# Patient Record
Sex: Male | Born: 1990 | Race: White | Hispanic: No | Marital: Single | Smoking: Never smoker
Health system: Southern US, Community
[De-identification: ages and names within clinical notes are randomized; demographics above are authoritative.]

---
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
## 2009-03-16 IMAGING — RF DG FLUORO GUIDE NDL PLC/BX
2 series · 2 of 2 positions shown · IV contrast (omniscan)
Comparison: none

CLINICAL DATA: Pre MRI.  
 RIGHT SHOULDER INJECTION FOR MR:
 The skin anterior to the right shoulder was cleansed and anesthetized.  A 22 gauge spinal needle was placed on one pass into the shoulder joint.  Hypaque mixed with dilute Omniscan was injected into the shoulder joint.  Limited filming shows no undersurface cuff abnormality.  The procedure was well-tolerated.

[Series 1: (hospital) · 1 of 1 slices shown (1 of 2)]
[im 1/1]
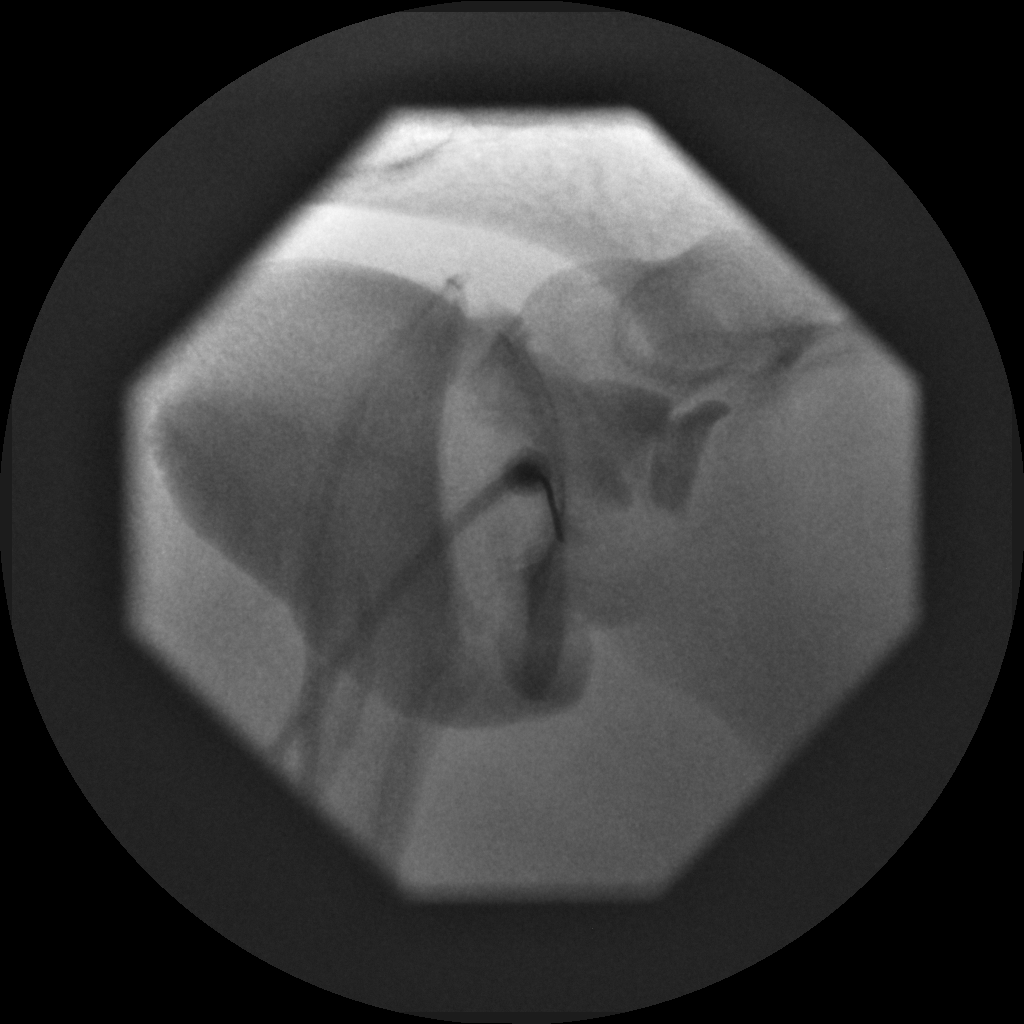

[Series 2: (hospital) · 1 of 1 slices shown (2 of 2)]
[im 1/1]
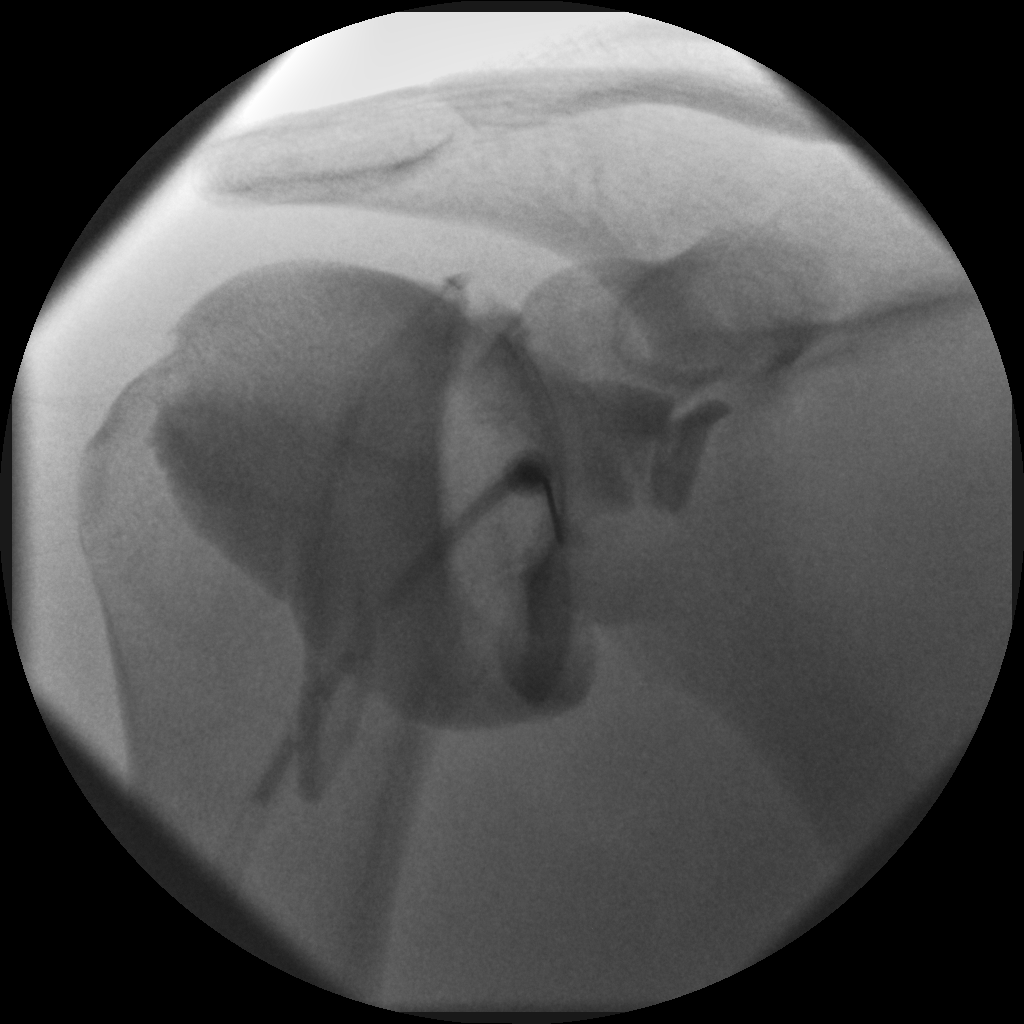

[2 of 2 positions shown; findings below may reference images not displayed]

IMPRESSION: Shoulder injection for MRI as described.

## 2012-10-06 DIAGNOSIS — B356 Tinea cruris: Secondary | ICD-10-CM | POA: Insufficient documentation

## 2015-01-17 ENCOUNTER — Other Ambulatory Visit: Payer: Self-pay | Admitting: Occupational Medicine

## 2015-01-17 ENCOUNTER — Ambulatory Visit
Admission: RE | Admit: 2015-01-17 | Discharge: 2015-01-17 | Disposition: A | Discharge status: Home / Self Care | Payer: No Typology Code available for payment source | Source: Ambulatory Visit | Attending: Occupational Medicine | Admitting: Occupational Medicine

## 2015-01-17 DIAGNOSIS — Z021 Encounter for pre-employment examination: Secondary | ICD-10-CM

## 2015-02-22 ENCOUNTER — Encounter (HOSPITAL_COMMUNITY): Payer: Self-pay | Admitting: Medical

## 2015-02-22 ENCOUNTER — Ambulatory Visit (HOSPITAL_COMMUNITY): Payer: BLUE CROSS/BLUE SHIELD | Admitting: Medical

## 2015-02-22 VITALS — BP 128/70 | HR 73 | Ht 72.0 in | Wt 215.0 lb

## 2015-02-22 DIAGNOSIS — F32A Depression, unspecified: Secondary | ICD-10-CM | POA: Insufficient documentation

## 2015-02-22 DIAGNOSIS — F411 Generalized anxiety disorder: Secondary | ICD-10-CM | POA: Insufficient documentation

## 2015-02-22 DIAGNOSIS — Z8659 Personal history of other mental and behavioral disorders: Secondary | ICD-10-CM | POA: Insufficient documentation

## 2015-02-22 DIAGNOSIS — F988 Other specified behavioral and emotional disorders with onset usually occurring in childhood and adolescence: Secondary | ICD-10-CM | POA: Insufficient documentation

## 2015-02-22 DIAGNOSIS — F329 Major depressive disorder, single episode, unspecified: Secondary | ICD-10-CM | POA: Insufficient documentation

## 2015-02-22 NOTE — Progress Notes (Signed)
Psychiatric Initial Adult Assessment   Patient Identification: Micheal Price MRN:  [id number] Date of Evaluation:  02/22/2015 Referral Source:Helene Kelp MD [facility] Subjective:  Patient ID: Micheal Price is a 25 y.o. (DOB 01-Jul-1990) male. HPI Comments: Winn presents today for a medication refill - he is not currently on any medication; he presents today requesting that he be restarted on ADHD medication; he had taken the medication earlier in his life but has not taken any ADHD medication in several years The patient's allergies, current medications, past family history, past medical history, past social history, past surgical history and problem list were reviewed and updated as appropriate.  Review of Systems  Psychiatric/Behavioral: Positive for decreased concentration.  All other systems reviewed and are negative. Objective:  BP 130/82 (BP Location: Right arm, Patient Position: Sitting) Pulse 76 Temp 98.4 F (36.9 C) (Oral) Ht 6' (1.829 m) Wt 221 lb (100.2 kg) BMI 29.97 kg/m2 Physical Exam  Constitutional: He appears well-developed and well-nourished. No distress.  Body mass index is 29.97 kg/(m^2). afebrile  HENT:  Head: Normocephalic and atraumatic.  Cardiovascular: Normal rate and regular rhythm.  Pulmonary/Chest: Effort normal and breath sounds normal.  Skin: He is not diaphoretic.  Psychiatric: He has a normal mood and affect. Judgment and thought content normal.  Nursing note and vitals reviewed. Assessment / Plan:  1. Poor concentration Ambulatory referral to Psychiatry  2. Attention deficit hyperactivity disorder (ADHD), predominantly inattentive type  Plan Take medications as directed Keep appointments with specialists as directed Good sleep hygiene Exercise 30-45 minutes 4-5 days a week Limit fast food to once a week or less Ideally BMI should be 25 or less Participate in regular social activities Return to clinic to refill ADHD medication if  your are diagnosed and treatment is started Patient's Medications  No medications on file  Orders Placed This Encounter  Procedures  . Ambulatory referral to Psychiatry   Chief Complaint:  Poor concentration Visit Diagnosis: No diagnosis found. Diagnosis:  There are no active problems to display for this patient.  History of Present Illness:  24 Y/O wm SEEKING ADDERALL BASED ON COLLEGE EXPERIENCE PENDING STARTING [employer] Feb 1 ,2017.  Anticipates fatigue and difficulty reading (focus-15 minute attention span) he experienced in [facility] 2012 while on [address] scholarship.He saw new provider at [facility] who referred him..Reports he had/has no difficulty with things he is interested in. Wants RX for Feb 1 - end of July -cant come for appts while in school. Relates he was tested in 3rd grade but did not require medication due to grades C and above.Failed to circle depression score on intake-PHQ ( score was 15 consistent with MDD He denies being/feeling depressed. He is not interested in alternatives to Adderall.Doesnt understand why he needs FU as he didn't have a lot of questions before when he got Adderall and got medicine without .  Associated Signs/Symptoms: Racing thoughts;Loss of interest;Excesssive worrying (about passing firefighter school);Poor concentration ASRS-v1.1 Score 36 Vague report of treatment for mental health problem when younger "Not sure" what; where Depression Symptoms:  PHQ 9 Score 15-pt unaware (Hypo) Manic Symptoms:  Distractibility, Labiality of Mood, Anxiety Symptoms:  Excessive Worry, Psychotic Symptoms:  None PTSD Symptoms: Negative  Past Medical History: No past medical history on file. No past surgical history on file. Family History: No family history on file. Social History:   Social History   Social History  . Marital Status: Single    Spouse Name: N/A  . Number of  Children: N/A  . Years of Education: N/A   Social History Main Topics  .  Smoking status: Never Smoker   . Smokeless tobacco: Current User    Types: Snuff  . Alcohol Use: 0.6 oz/week    1 Cans of beer per week  . Drug Use: No  . Sexual Activity: Not Asked   Other Topics Concern  . None   Social History Narrative  . 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 Patient reports that he transferred from Dr. Sonny Dandy office. He had trouble focusing in school. He is 19. He was diagnosed with ADHD as a child. Stopped medications in 2011. He said that the Ritalin, he did not like it. He did not like the way it made him feel. He denies addiction problems. No depression, suicidal or homicidal ideation. Current Medications None. Allergies No known drug allergies. No tobacco or drug abuse. Occasional alcohol. Single male. Hospitalizations None. Surgeries Appendix. Family History Benign. Occupation Consulting civil engineer. Objective GENERAL: Well-nourished white male. VITAL SIGNS: Noted in the chart. HEENT: PERRLA, both direct and consensual; EOMI. NECK: Without JVD, bruits or thyromegaly. HEART: Regular rate and rhythm without extra sounds. LUNGS: CTA. ABDOMEN: Soft without MTO; bowel sounds present. EXTREMITIES: Without clubbing, cyanosis or edema; equal pulses bilaterally. Assessment And Plan History of attention deficit hyperactivity disorder. His mother did refer him. Mother is a patient here, and those will be transferred from [facility]. Will continue to monitor. Will try Adderall XR 30 mg daily, #30 with no refills. Side effects discussed. Will follow up in 1 month, sooner if need Back to top of  Progress Notes [name], NP - 10/21/2010 9:38 AM EDT Formatting of this note may be different from the original. Subjective  Patient ID: Micheal Price is a 25 y.o. (DOB July 19, 1990) male.  Patient presents with  . Medication Refill  . ADD  had all A and B's, doing well in baseball, going [employer]. Denies chest pain, denies history of heart disease.  Past Medical History, Past Surgery History, Allergies, Social History, and Family History were reviewed and updated.  Review of Systems is complete and negative except as noted. Objective  BP 122/68  Pulse 70  Resp 17  Ht 6' (1.829 m)  Wt 207 lb 9.6 oz (94.167 kg)  BMI 28.16 kg/m2 General: Well Developed, Well Nourished, No distress HEENT: Normocephalic, atraumatic, PERRLA, Conjunctiva normal, Bilateral EAC and TM normal, Nares normal, Oropharynx moist and clear Neck: Supple with normal range of motion, No LAD  CV: S1S2, RRR without MGR  Lungs: CTA with normal effort Abdomen: BS active, soft, NTND, No HSM/Masses  Skin: No Focal Rashes  Extremities: No C/C/E. DP/PT 2+ Neuro: No focal deficits, CN II-XII intact  Impression  1. ADD (attention deficit disorder with hyperactivity) amphetamine-dextroamphetamine (ADDERALL) 30 MG tablet, 7 Drug-[facility], Comp. Metabolic Panel (14), POCT CBC W/O DIFF  2. Encounter for long-term (current) use of other medications 7 Drug-Unbund, Comp. Metabolic Panel (14), POCT CBC W/O DIFF Controlled Substance management agreement was explained and signed by patient and provider. Patient understands that breaking this agreement can result in dismissal  from practice or discontinuation of medication being prescribed as stated on pain contract. Agreement is scanned into system and FYI updated.  1. ADD (attention deficit disorder with hyperactivity)  2. Encounter for long-term (current) use of other medications     Musculoskeletal: Strength & Muscle Tone: within normal limits Gait  & Station: normal Patient leans: N/A  Psychiatric Specialty Exam: HPIas above  Review of Systems  Constitutional: Negative for fever, chills, weight loss, malaise/fatigue and diaphoresis.  Neurological: Negative for dizziness, tingling, tremors, sensory change, speech change, focal weakness, seizures, loss of consciousness and weakness.  Psychiatric/Behavioral: Positive for depression (asymptomatic to patient-thinks it is ADD). Negative for suicidal ideas, hallucinations, memory loss and substance abuse. The patient is nervous/anxious and has insomnia.   All other systems reviewed and are negative.   Blood pressure 128/70, pulse 73, height 6' (1.829 m), weight 215 lb (97.523 kg), SpO2 96 %.Body mass index is 29.15 kg/(m^2).  General Appearance: Casual and Well Groomed  Eye Contact:  Good  Speech:  Normal Rate, Pressured when excited  Volume:  Normal  Mood:  Anxious  Affect:  Congruent  Thought Process:  Circumstantial, Coherent and Goal Directed  Orientation:  Full (Time, Place, and Person)  Thought Content:  WDL and Focused on obtaining Adderall to succeed at [employer] school  Suicidal Thoughts:  No  Homicidal Thoughts:  No  Memory:  Negative  Judgement:  Fair  Insight:  Lacking  Psychomotor Activity:  Normal  Concentration:  Intact for visit  Recall:  [employer] of Knowledge:Fair  Language: Fair  Akathisia:  NA  Handed:  Right  AIMS (if indicated):  na  Assets:  Desire for Improvement Financial Resources/Insurance Housing Physical Health Social Support Transportation Vocational/Educational  ADL's:  Intact  Cognition: Impaired,  Mild not in touch with feelings  Sleep:  Minor problem 1/3   Is the patient at risk to self?  No. Has the patient been a risk to self in the past 6 months?  No. Has the patient been a risk to self within the distant past?  No. Is the patient a risk to others?  No. Has the patient been a risk to others in the past 6 months?  No. Has the  patient been a risk to others within the distant past?  No.  Allergies:  No Known Allergies Current Medications: No current outpatient prescriptions on file.   No current facility-administered medications for this visit.    Previous Psychotropic Medications: Adderall  Substance Abuse History in the last 12 months:  No.  Consequences of Substance Abuse: NA  Medical Decision Making:  Review and summation of old records (2), Review of Medication Regimen & Side Effects (2) and Review of New Medication or Change in Dosage (2)  Treatment Plan Summary: Pt informed we cannot meet his perceived needs and he should reconnect with his old provider who may have weekend FU if needed. .Advised we understand he does not feel his depression at a conscious level but that it IS there controlled at present.  Symptoms of depression and ADD/ADHD can be same particularly focus and concentration. He is not interested in alternatives to Adderall that require FU.    Maryjean Morn 1/19/201711:58 AM

## 2017-04-17 IMAGING — CR DG CHEST 1V
1 series · 1 of 1 positions shown · non-contrast
Comparison: None impacts

CLINICAL DATA: Pre-employment for [employer],
asymptomatic, nonsmoker.

EXAM:
CHEST 1 VIEW

[w chest pa]
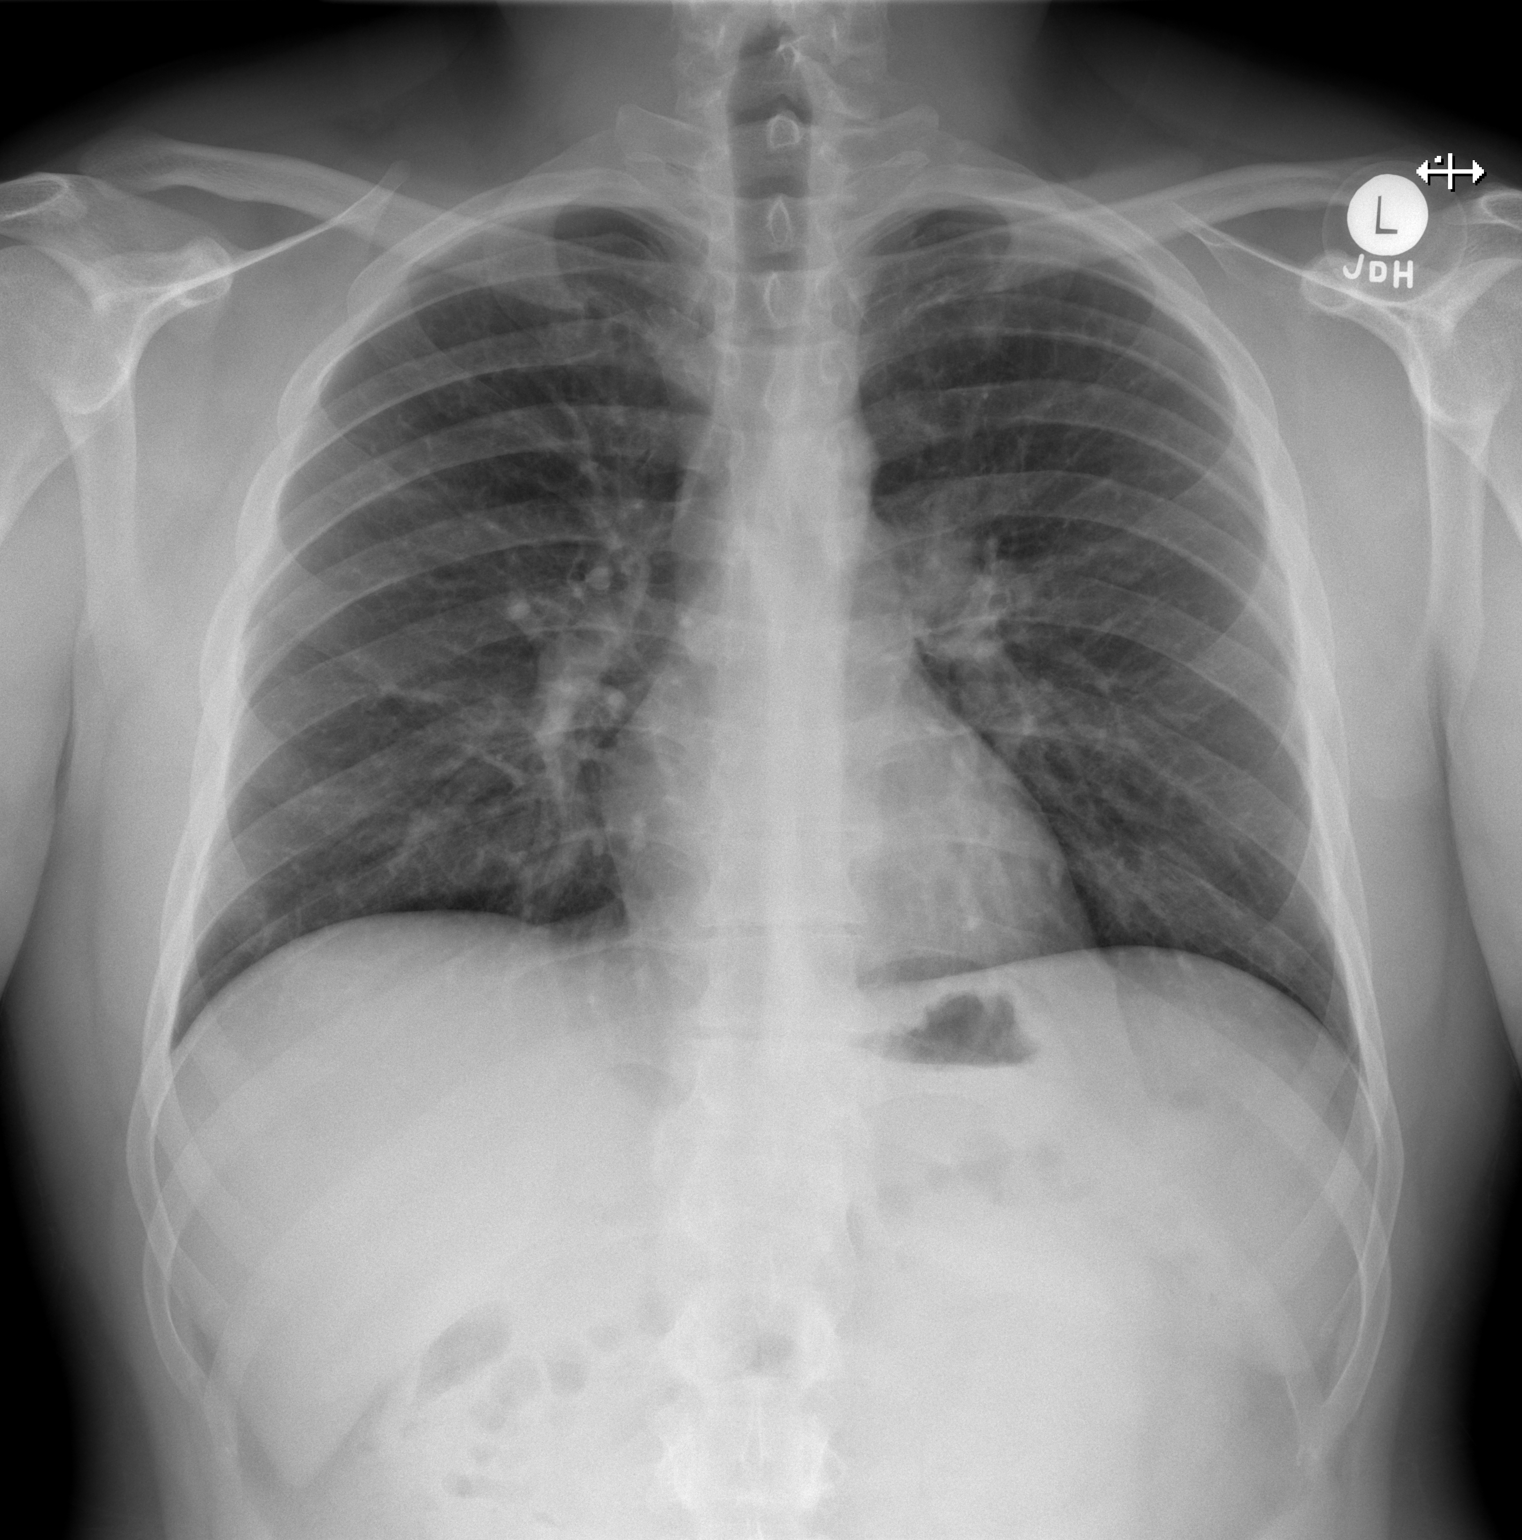

[1 of 1 positions shown; findings below may reference images not displayed]

FINDINGS: The lungs are adequately inflated. There is no focal infiltrate.
There is no pleural effusion. The heart and pulmonary vascularity
are normal. The mediastinum is normal in width. The bony thorax
exhibits no acute abnormality.
IMPRESSION: There is no active cardiopulmonary disease.

## 2023-11-30 ENCOUNTER — Other Ambulatory Visit (HOSPITAL_BASED_OUTPATIENT_CLINIC_OR_DEPARTMENT_OTHER): Payer: Self-pay | Admitting: Family Medicine

## 2023-11-30 DIAGNOSIS — Z8249 Family history of ischemic heart disease and other diseases of the circulatory system: Secondary | ICD-10-CM

## 2024-02-11 ENCOUNTER — Other Ambulatory Visit (HOSPITAL_BASED_OUTPATIENT_CLINIC_OR_DEPARTMENT_OTHER)

## 2024-02-29 ENCOUNTER — Ambulatory Visit (HOSPITAL_BASED_OUTPATIENT_CLINIC_OR_DEPARTMENT_OTHER)
Admission: RE | Admit: 2024-02-29 | Discharge: 2024-02-29 | Disposition: A | Discharge status: Home / Self Care | Payer: Self-pay | Source: Ambulatory Visit | Attending: Family Medicine | Admitting: Family Medicine

## 2024-02-29 DIAGNOSIS — Z8249 Family history of ischemic heart disease and other diseases of the circulatory system: Secondary | ICD-10-CM
